# Patient Record
Sex: Female | Born: 1965 | Race: Black or African American | Hispanic: No | Marital: Married | Smoking: Never smoker
Health system: Southern US, Community
[De-identification: ages and names within clinical notes are randomized; demographics above are authoritative.]

## PROBLEM LIST (undated history)

## (undated) DIAGNOSIS — E0590 Thyrotoxicosis, unspecified without thyrotoxic crisis or storm: Secondary | ICD-10-CM

## (undated) DIAGNOSIS — K219 Gastro-esophageal reflux disease without esophagitis: Secondary | ICD-10-CM

## (undated) DIAGNOSIS — D649 Anemia, unspecified: Secondary | ICD-10-CM

---

## 2011-10-19 ENCOUNTER — Encounter (HOSPITAL_BASED_OUTPATIENT_CLINIC_OR_DEPARTMENT_OTHER): Payer: Self-pay | Admitting: *Deleted

## 2011-10-19 DIAGNOSIS — IMO0001 Reserved for inherently not codable concepts without codable children: Secondary | ICD-10-CM | POA: Insufficient documentation

## 2011-10-19 DIAGNOSIS — Z043 Encounter for examination and observation following other accident: Secondary | ICD-10-CM | POA: Insufficient documentation
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
## 2011-10-20 ENCOUNTER — Emergency Department (HOSPITAL_BASED_OUTPATIENT_CLINIC_OR_DEPARTMENT_OTHER)
Admission: EM | Admit: 2011-10-20 | Discharge: 2011-10-20 | Disposition: A | Discharge status: Home / Self Care | Payer: No Typology Code available for payment source | Attending: Emergency Medicine | Admitting: Emergency Medicine

## 2011-10-20 ENCOUNTER — Encounter (HOSPITAL_BASED_OUTPATIENT_CLINIC_OR_DEPARTMENT_OTHER): Payer: Self-pay | Admitting: Emergency Medicine

## 2011-10-20 DIAGNOSIS — M7918 Myalgia, other site: Secondary | ICD-10-CM

## 2011-10-20 NOTE — ED Notes (Signed)
D/c to home- no rx given
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
## 2011-10-20 NOTE — ED Notes (Signed)
Hunt MD at bedside. 

## 2013-01-15 ENCOUNTER — Other Ambulatory Visit: Payer: Self-pay

## 2013-01-15 DIAGNOSIS — Z1231 Encounter for screening mammogram for malignant neoplasm of breast: Secondary | ICD-10-CM

## 2013-02-17 ENCOUNTER — Ambulatory Visit

## 2013-02-26 ENCOUNTER — Ambulatory Visit

## 2013-03-16 ENCOUNTER — Ambulatory Visit

## 2013-03-26 ENCOUNTER — Ambulatory Visit
Admission: RE | Admit: 2013-03-26 | Discharge: 2013-03-26 | Disposition: A | Discharge status: Home / Self Care | Source: Ambulatory Visit

## 2013-03-26 DIAGNOSIS — Z1231 Encounter for screening mammogram for malignant neoplasm of breast: Secondary | ICD-10-CM

## 2013-05-25 ENCOUNTER — Other Ambulatory Visit (HOSPITAL_COMMUNITY): Payer: Self-pay | Admitting: Endocrinology

## 2013-05-25 DIAGNOSIS — E0590 Thyrotoxicosis, unspecified without thyrotoxic crisis or storm: Secondary | ICD-10-CM

## 2013-06-03 ENCOUNTER — Encounter (HOSPITAL_COMMUNITY): Admission: RE | Admit: 2013-06-03 | Source: Ambulatory Visit

## 2013-06-04 ENCOUNTER — Encounter (HOSPITAL_COMMUNITY)

## 2013-06-28 ENCOUNTER — Encounter (HOSPITAL_COMMUNITY)
Admission: RE | Admit: 2013-06-28 | Discharge: 2013-06-28 | Disposition: A | Discharge status: Home / Self Care | Source: Ambulatory Visit | Attending: Endocrinology | Admitting: Endocrinology

## 2013-06-28 DIAGNOSIS — E0590 Thyrotoxicosis, unspecified without thyrotoxic crisis or storm: Secondary | ICD-10-CM

## 2013-06-28 DIAGNOSIS — E0510 Thyrotoxicosis with toxic single thyroid nodule without thyrotoxic crisis or storm: Secondary | ICD-10-CM | POA: Insufficient documentation

## 2013-06-29 ENCOUNTER — Encounter (HOSPITAL_COMMUNITY)
Admission: RE | Admit: 2013-06-29 | Discharge: 2013-06-29 | Disposition: A | Discharge status: Home / Self Care | Source: Ambulatory Visit | Attending: Endocrinology | Admitting: Endocrinology

## 2013-06-29 ENCOUNTER — Encounter (HOSPITAL_COMMUNITY): Payer: Self-pay

## 2013-06-29 HISTORY — DX: Thyrotoxicosis, unspecified without thyrotoxic crisis or storm: E05.90

## 2013-06-29 MED ORDER — SODIUM IODIDE I 131 CAPSULE
8.6000 | Freq: Once | INTRAVENOUS | Status: AC | PRN
  Administered 2013-06-29: 8.6 via ORAL

## 2013-06-29 MED ORDER — SODIUM PERTECHNETATE TC 99M INJECTION
10.8000 | Freq: Once | INTRAVENOUS | Status: AC | PRN
  Administered 2013-06-29: 10.8 via INTRAVENOUS

## 2018-01-07 ENCOUNTER — Other Ambulatory Visit: Payer: Self-pay | Admitting: Obstetrics and Gynecology

## 2018-01-07 DIAGNOSIS — Z1231 Encounter for screening mammogram for malignant neoplasm of breast: Secondary | ICD-10-CM

## 2018-03-20 ENCOUNTER — Ambulatory Visit

## 2018-04-21 ENCOUNTER — Ambulatory Visit

## 2018-05-21 ENCOUNTER — Ambulatory Visit

## 2018-07-28 ENCOUNTER — Ambulatory Visit

## 2018-08-11 ENCOUNTER — Other Ambulatory Visit: Payer: Self-pay | Admitting: Obstetrics and Gynecology

## 2018-08-11 DIAGNOSIS — N644 Mastodynia: Secondary | ICD-10-CM

## 2018-09-05 ENCOUNTER — Encounter (HOSPITAL_BASED_OUTPATIENT_CLINIC_OR_DEPARTMENT_OTHER): Payer: Self-pay | Admitting: *Deleted

## 2018-09-05 ENCOUNTER — Emergency Department (HOSPITAL_BASED_OUTPATIENT_CLINIC_OR_DEPARTMENT_OTHER)
Admission: EM | Admit: 2018-09-05 | Discharge: 2018-09-06 | Disposition: A | Discharge status: Home / Self Care | Attending: Emergency Medicine | Admitting: Emergency Medicine

## 2018-09-05 ENCOUNTER — Other Ambulatory Visit: Payer: Self-pay

## 2018-09-05 DIAGNOSIS — R1013 Epigastric pain: Secondary | ICD-10-CM | POA: Diagnosis present

## 2018-09-05 DIAGNOSIS — N3000 Acute cystitis without hematuria: Secondary | ICD-10-CM | POA: Diagnosis not present

## 2018-09-05 DIAGNOSIS — Z79899 Other long term (current) drug therapy: Secondary | ICD-10-CM | POA: Insufficient documentation

## 2018-09-05 HISTORY — DX: Gastro-esophageal reflux disease without esophagitis: K21.9

## 2018-09-05 HISTORY — DX: Anemia, unspecified: D64.9

## 2018-09-05 LAB — CBC
HCT: 43.6 % (ref 36.0–46.0)
Hemoglobin: 13.9 g/dL (ref 12.0–15.0)
MCH: 26.3 pg (ref 26.0–34.0)
MCHC: 31.9 g/dL (ref 30.0–36.0)
MCV: 82.6 fL (ref 80.0–100.0)
NRBC: 0 % (ref 0.0–0.2)
Platelets: 191 10*3/uL (ref 150–400)
RBC: 5.28 MIL/uL — AB (ref 3.87–5.11)
RDW: 14.5 % (ref 11.5–15.5)
WBC: 7.4 10*3/uL (ref 4.0–10.5)

## 2018-09-05 LAB — LIPASE, BLOOD: Lipase: 26 U/L (ref 11–51)

## 2018-09-05 LAB — COMPREHENSIVE METABOLIC PANEL
ALK PHOS: 113 U/L (ref 38–126)
ALT: 22 U/L (ref 0–44)
ANION GAP: 7 (ref 5–15)
AST: 22 U/L (ref 15–41)
Albumin: 3.7 g/dL (ref 3.5–5.0)
BILIRUBIN TOTAL: 0.4 mg/dL (ref 0.3–1.2)
BUN: 13 mg/dL (ref 6–20)
CALCIUM: 8.8 mg/dL — AB (ref 8.9–10.3)
CO2: 24 mmol/L (ref 22–32)
CREATININE: 0.92 mg/dL (ref 0.44–1.00)
Chloride: 103 mmol/L (ref 98–111)
Glucose, Bld: 94 mg/dL (ref 70–99)
Potassium: 3.2 mmol/L — ABNORMAL LOW (ref 3.5–5.1)
Sodium: 134 mmol/L — ABNORMAL LOW (ref 135–145)
TOTAL PROTEIN: 7 g/dL (ref 6.5–8.1)

## 2018-09-05 MED ORDER — ALUM & MAG HYDROXIDE-SIMETH 200-200-20 MG/5ML PO SUSP
30.0000 mL | Freq: Once | ORAL | Status: AC
  Administered 2018-09-06: 30 mL via ORAL
  Filled 2018-09-05: qty 30

## 2018-09-05 NOTE — ED Notes (Signed)
Pt given water per MD. Pt also attempting to obtain urine sample.

## 2018-09-05 NOTE — ED Triage Notes (Signed)
Pt reports epigastric pain and nausea x 1 week. States today nausea got worse and she had tightness under her left breast. She went to Urgent Care last week for similar Sx

## 2018-09-05 NOTE — ED Provider Notes (Signed)
[facility] EMERGENCY DEPARTMENT Provider Note  CSN: [id number] Arrival date & time: 09/05/18 2146  Chief Complaint(s) Abdominal Pain  HPI Roberta Newman is a 53 y.o. female   The history is provided by the patient.  Abdominal Pain  Pain location:  Epigastric Pain quality comment:  Gurgling Pain radiates to:  Does not radiate Pain severity:  Mild Onset quality:  Gradual Duration:  1 week Timing:  Intermittent Progression:  Waxing and waning Chronicity:  New Context: diet changes   Relieved by: certain nonacidic foods. Exacerbated by: certain acidic foods. Ineffective treatments:  NSAIDs Associated symptoms: nausea   Associated symptoms: no anorexia, no chills, no constipation, no diarrhea, no fever and no melena     Past Medical History Past Medical History:  Diagnosis Date  . Acid reflux   . Anemia   . Hyperthyroidism    There are no active problems to display for this patient.  Home Medication(s) Prior to Admission medications   Medication Sig Start Date End Date Taking? Authorizing Provider  esomeprazole (NEXIUM) 20 MG capsule Take 20 mg by mouth daily at 12 noon.   Yes [provider]  FeFum-FePoly-FA-B Cmp-C-Biot (INTEGRA PLUS) CAPS TAKE 1 CAPSULE BY MOUTH ONCE DAILY 02/17/18  Yes [provider]  IRON COMBINATIONS PO Take by mouth.    [provider]  nitrofurantoin, macrocrystal-monohydrate, (MACROBID) 100 MG capsule Take 1 capsule (100 mg total) by mouth 2 (two) times daily for 5 days. 09/06/18 09/11/18  Nira Connardama, Hani Patnode Eduardo, MD                                                                                                                                    Past Surgical History Past Surgical History:  Procedure Laterality Date  . CESAREAN SECTION     Family History No family history on file.  Social History Social History   Tobacco Use  . Smoking status: Never Smoker  . Smokeless tobacco: Never Used  Substance Use  Topics  . Alcohol use: Never    Frequency: Never  . Drug use: Never   Allergies Penicillins  Review of Systems Review of Systems  Constitutional: Negative for chills and fever.  Gastrointestinal: Positive for abdominal pain and nausea. Negative for anorexia, constipation, diarrhea and melena.   All other systems are reviewed and are negative for acute change except as noted in the HPI  Physical Exam Vital Signs  I have reviewed the triage vital signs BP (!) 142/94   Pulse 70   Temp 97.9 F (36.6 C) (Oral)   Resp 16   Ht 5\' 3"  (1.6 m)   Wt 75.8 kg   SpO2 100%   BMI 29.58 kg/m   Physical Exam Vitals signs reviewed.  Constitutional:      General: She is not in acute distress.    Appearance: She is well-developed. She is not diaphoretic.  HENT:  Head: Normocephalic and atraumatic.     Right Ear: External ear normal.     Left Ear: External ear normal.     Nose: Nose normal.  Eyes:     General: No scleral icterus.    Conjunctiva/sclera: Conjunctivae normal.  Neck:     Musculoskeletal: Normal range of motion.     Trachea: Phonation normal.  Cardiovascular:     Rate and Rhythm: Normal rate and regular rhythm.  Pulmonary:     Effort: Pulmonary effort is normal. No respiratory distress.     Breath sounds: No stridor.  Abdominal:     General: There is no distension.     Tenderness: There is abdominal tenderness in the epigastric area and left upper quadrant. There is no guarding or rebound. Negative signs include Murphy's sign.  Musculoskeletal: Normal range of motion.  Neurological:     Mental Status: She is alert and oriented to person, place, and time.  Psychiatric:        Behavior: Behavior normal.     ED Results and Treatments Labs (all labs ordered are listed, but only abnormal results are displayed) Labs Reviewed  COMPREHENSIVE METABOLIC PANEL - Abnormal; Notable for the following components:      Result Value   Sodium 134 (*)    Potassium 3.2 (*)      Calcium 8.8 (*)    All other components within normal limits  CBC - Abnormal; Notable for the following components:   RBC 5.28 (*)    All other components within normal limits  URINALYSIS, ROUTINE W REFLEX MICROSCOPIC - Abnormal; Notable for the following components:   APPearance CLOUDY (*)    Specific Gravity, Urine <1.005 (*)    Ketones, ur 15 (*)    Nitrite POSITIVE (*)    Leukocytes, UA SMALL (*)    All other components within normal limits  URINALYSIS, MICROSCOPIC (REFLEX) - Abnormal; Notable for the following components:   Bacteria, UA MANY (*)    All other components within normal limits  URINE CULTURE  LIPASE, BLOOD  PREGNANCY, URINE                                                                                                                         EKG  EKG Interpretation  Date/Time:  Saturday September 05 2018 21:54:26 EST Ventricular Rate:  63 PR Interval:  166 QRS Duration: 94 QT Interval:  388 QTC Calculation: 397 R Axis:   76 Text Interpretation:  Normal sinus rhythm Minimal voltage criteria for LVH, may be normal variant Borderline ECG Confirmed by Blane OharaZavitz, Joshua [id number]) on 09/05/2018 10:38:22 PM      Radiology No results found. Pertinent labs & imaging results that were available during my care of the patient were reviewed by me and considered in my medical decision making (see chart for details).  Medications Ordered in ED Medications  alum & mag hydroxide-simeth (MAALOX/MYLANTA) 200-200-20 MG/5ML suspension 30 mL (30 mLs Oral Given 09/06/18 0028)  nitrofurantoin (  macrocrystal-monohydrate) (MACROBID) capsule 100 mg (100 mg Oral Given 09/06/18 0119)                                                                                                                                    Procedures Procedures  (including critical care time)  Medical Decision Making / ED Course I have reviewed the nursing notes for this encounter and the patient's prior records (if  available in EHR or on provided paperwork).    Patient presents with epigastric discomfort for approximately 1 week exacerbated with certain foods.  Abdomen with mild epigastric and left upper quadrant discomfort to palpation without evidence of peritonitis.  Suspicion for gastritis versus dyspepsia.  Will obtain screening labs to rule out other serious intra-abdominal inflammatory/infectious processes.  Labs reassuring without leukocytosis or anemia.  No significant electrolyte derangements or renal sufficiency.  No evidence of biliary obstruction or pancreatitis.  Presentation not suspicious for acute cholecystitis.  UA did reveal evidence of urinary tract infection.  Patient does not have symptoms concerning for pyelonephritis.  Will treat with Macrobid outpatient.  Doubt other serious intra-abdominal Fama to assess infectious process requiring imaging at this time.  Final Clinical Impression(s) / ED Diagnoses Final diagnoses:  Acute cystitis without hematuria  Dyspepsia   Disposition: Discharge  Condition: Good  I have discussed the results, Dx and Tx plan with the patient who expressed understanding and agree(s) with the plan. Discharge instructions discussed at great length. The patient was given strict return precautions who verbalized understanding of the instructions. No further questions at time of discharge.    ED Discharge Orders         Ordered    nitrofurantoin, macrocrystal-monohydrate, (MACROBID) 100 MG capsule  2 times daily     09/06/18 0136           Follow Up: Primary care provider  Schedule an appointment as soon as possible for a visit  in 5-7 days to recheck urine for appropriate treatment      This chart was dictated using voice recognition software.  Despite best efforts to proofread,  errors can occur which can change the documentation meaning.   Nira Conn, MD 09/06/18 [phone]

## 2018-09-06 LAB — URINALYSIS, ROUTINE W REFLEX MICROSCOPIC
BILIRUBIN URINE: NEGATIVE
Glucose, UA: NEGATIVE mg/dL
HGB URINE DIPSTICK: NEGATIVE
KETONES UR: 15 mg/dL — AB
Nitrite: POSITIVE — AB
PROTEIN: NEGATIVE mg/dL
Specific Gravity, Urine: <1.005 — ABNORMAL LOW (ref 1.005–1.030)
pH: 6 (ref 5.0–8.0)

## 2018-09-06 LAB — URINALYSIS, MICROSCOPIC (REFLEX)

## 2018-09-06 LAB — PREGNANCY, URINE: PREG TEST UR: NEGATIVE

## 2018-09-06 MED ORDER — NITROFURANTOIN MONOHYD MACRO 100 MG PO CAPS
100.0000 mg | ORAL_CAPSULE | Freq: Once | ORAL | Status: AC
  Administered 2018-09-06: 100 mg via ORAL
  Filled 2018-09-06: qty 1

## 2018-09-06 MED ORDER — NITROFURANTOIN MONOHYD MACRO 100 MG PO CAPS: 100.0000 mg | ORAL_CAPSULE | Freq: Two times a day (BID) | ORAL | 0 refills | Status: AC

## 2018-09-06 NOTE — ED Notes (Signed)
Pt states she feels "a lot better" after GI cocktail. States her pain is now 5/10.

## 2018-09-08 LAB — URINE CULTURE

## 2018-09-09 ENCOUNTER — Telehealth: Payer: Self-pay

## 2018-09-09 NOTE — Telephone Encounter (Signed)
Post ED Visit - Positive Culture Follow-up  Culture report reviewed by antimicrobial stewardship pharmacist:  []  Enzo Bi, Pharm.D. []  Celedonio Miyamoto, Pharm.D., BCPS AQ-ID []  Garvin Fila, Pharm.D., BCPS []  Georgina Pillion, Pharm.D., BCPS []  [place], [address].D., BCPS, AAHIVP []  Estella Husk, Pharm.D., BCPS, AAHIVP []  Lysle Pearl, PharmD, BCPS []  Phillips Climes, PharmD, BCPS []  Agapito Games, PharmD, BCPS []  Verlan Friends, PharmD [facility] D Positive urine culture Treated with Macrobid, organism sensitive to the same and no further patient follow-up is required at this time.  Jerry Caras 09/09/2018, 12:55 PM

## 2018-09-10 ENCOUNTER — Ambulatory Visit
Admission: RE | Admit: 2018-09-10 | Discharge: 2018-09-10 | Disposition: A | Discharge status: Home / Self Care | Source: Ambulatory Visit | Attending: Obstetrics and Gynecology | Admitting: Obstetrics and Gynecology

## 2018-09-10 DIAGNOSIS — Z1231 Encounter for screening mammogram for malignant neoplasm of breast: Secondary | ICD-10-CM

## 2020-11-01 ENCOUNTER — Emergency Department (HOSPITAL_BASED_OUTPATIENT_CLINIC_OR_DEPARTMENT_OTHER)

## 2020-11-01 ENCOUNTER — Other Ambulatory Visit: Payer: Self-pay

## 2020-11-01 ENCOUNTER — Encounter (HOSPITAL_BASED_OUTPATIENT_CLINIC_OR_DEPARTMENT_OTHER): Payer: Self-pay

## 2020-11-01 ENCOUNTER — Emergency Department (HOSPITAL_BASED_OUTPATIENT_CLINIC_OR_DEPARTMENT_OTHER)
Admission: EM | Admit: 2020-11-01 | Discharge: 2020-11-01 | Disposition: A | Discharge status: Home / Self Care | Attending: Emergency Medicine | Admitting: Emergency Medicine

## 2020-11-01 DIAGNOSIS — K2100 Gastro-esophageal reflux disease with esophagitis, without bleeding: Secondary | ICD-10-CM

## 2020-11-01 DIAGNOSIS — R079 Chest pain, unspecified: Secondary | ICD-10-CM | POA: Diagnosis present

## 2020-11-01 LAB — COMPREHENSIVE METABOLIC PANEL
ALT: 22 U/L (ref 0–44)
AST: 24 U/L (ref 15–41)
Albumin: 3.9 g/dL (ref 3.5–5.0)
Alkaline Phosphatase: 110 U/L (ref 38–126)
Anion gap: 9 (ref 5–15)
BUN: 17 mg/dL (ref 6–20)
CO2: 26 mmol/L (ref 22–32)
Calcium: 9.2 mg/dL (ref 8.9–10.3)
Chloride: 102 mmol/L (ref 98–111)
Creatinine, Ser: 0.96 mg/dL (ref 0.44–1.00)
GFR, Estimated: >60 mL/min (ref >60)
Glucose, Bld: 105 mg/dL — ABNORMAL HIGH (ref 70–99)
Potassium: 3.3 mmol/L — ABNORMAL LOW (ref 3.5–5.1)
Sodium: 137 mmol/L (ref 135–145)
Total Bilirubin: 0.3 mg/dL (ref 0.3–1.2)
Total Protein: 7.5 g/dL (ref 6.5–8.1)

## 2020-11-01 LAB — CBC WITH DIFFERENTIAL/PLATELET
Abs Immature Granulocytes: 0.01 K/uL (ref 0.00–0.07)
Basophils Absolute: 0 K/uL (ref 0.0–0.1)
Basophils Relative: 1 %
Eosinophils Absolute: 0.3 K/uL (ref 0.0–0.5)
Eosinophils Relative: 6 %
HCT: 44.7 % (ref 36.0–46.0)
Hemoglobin: 14.6 g/dL (ref 12.0–15.0)
Immature Granulocytes: 0 %
Lymphocytes Relative: 40 %
Lymphs Abs: 1.8 K/uL (ref 0.7–4.0)
MCH: 26.4 pg (ref 26.0–34.0)
MCHC: 32.7 g/dL (ref 30.0–36.0)
MCV: 81 fL (ref 80.0–100.0)
Monocytes Absolute: 0.3 K/uL (ref 0.1–1.0)
Monocytes Relative: 7 %
Neutro Abs: 2.1 K/uL (ref 1.7–7.7)
Neutrophils Relative %: 46 %
Platelets: 210 K/uL (ref 150–400)
RBC: 5.52 MIL/uL — ABNORMAL HIGH (ref 3.87–5.11)
RDW: 15 % (ref 11.5–15.5)
WBC: 4.4 K/uL (ref 4.0–10.5)
nRBC: 0 % (ref 0.0–0.2)

## 2020-11-01 LAB — TROPONIN I (HIGH SENSITIVITY): Troponin I (High Sensitivity): 3 ng/L (ref <18)

## 2020-11-01 MED ORDER — ALUM & MAG HYDROXIDE-SIMETH 200-200-20 MG/5ML PO SUSP
30.0000 mL | Freq: Once | ORAL | Status: AC
  Administered 2020-11-01: 30 mL via ORAL
  Filled 2020-11-01: qty 30

## 2020-11-01 MED ORDER — LIDOCAINE VISCOUS HCL 2 % MT SOLN
15.0000 mL | Freq: Once | OROMUCOSAL | Status: AC
  Administered 2020-11-01: 15 mL via ORAL
  Filled 2020-11-01: qty 15

## 2020-11-01 NOTE — Discharge Instructions (Addendum)
Follow up with your Physician for recheck.  Take prilosec as directed

## 2020-11-01 NOTE — ED Notes (Signed)
When questioned on response to pain medication, pt states she felt "fantastic" ED PA in to see pt

## 2020-11-01 NOTE — ED Provider Notes (Signed)
[facility] EMERGENCY DEPARTMENT Provider Note   CSN: [id number] Arrival date & time: 11/01/20  1619     History Chief Complaint  Patient presents with  . Chest Pain    Roberta Newman is a 55 y.o. female.  Pt reports she has had 3 days of sharp pain in her mid upper chest.  Pt reports she has a pain shoot thru her chest.  Pt reports pain last for a second and then is gone.  Pt has  A history of reflux.    The history is provided by the patient. No language interpreter was used.  Chest Pain Pain quality: aching   Pain radiates to:  Does not radiate Pain severity:  Moderate Onset quality:  Gradual Timing:  Constant Progression:  Worsening Chronicity:  New Relieved by:  Nothing      Past Medical History:  Diagnosis Date  . Acid reflux   . Anemia   . Hyperthyroidism     There are no problems to display for this patient.   Past Surgical History:  Procedure Laterality Date  . CESAREAN SECTION       OB History   No obstetric history on file.     Family History  Problem Relation Age of Onset  . Breast cancer Neg Hx     Social History   Tobacco Use  . Smoking status: Never Smoker  . Smokeless tobacco: Never Used  Vaping Use  . Vaping Use: Never used  Substance Use Topics  . Alcohol use: Never  . Drug use: Never    Home Medications Prior to Admission medications   Medication Sig Start Date End Date Taking? Authorizing Provider  esomeprazole (NEXIUM) 20 MG capsule Take 20 mg by mouth daily at 12 noon.    [provider]  FeFum-FePoly-FA-B Cmp-C-Biot (INTEGRA PLUS) CAPS TAKE 1 CAPSULE BY MOUTH ONCE DAILY 02/17/18   [provider]  IRON COMBINATIONS PO Take by mouth.    [provider]    Allergies    Penicillins  Review of Systems   Review of Systems  Cardiovascular: Positive for chest pain.  All other systems reviewed and are negative.   Physical Exam Updated Vital Signs BP (!) 143/89   Pulse 74    Temp 97.7 F (36.5 C) (Oral)   Resp 20   Ht 5\' 3"  (1.6 m)   Wt 77.1 kg   SpO2 99%   BMI 30.11 kg/m   Physical Exam Vitals and nursing note reviewed.  Constitutional:      Appearance: She is well-developed.  HENT:     Head: Normocephalic.  Cardiovascular:     Heart sounds: Normal heart sounds.  Pulmonary:     Effort: Pulmonary effort is normal.  Abdominal:     General: Bowel sounds are normal. There is no distension.     Palpations: Abdomen is soft.  Musculoskeletal:        General: Normal range of motion.     Cervical back: Normal range of motion.  Skin:    General: Skin is warm.  Neurological:     General: No focal deficit present.     Mental Status: She is alert and oriented to person, place, and time.     ED Results / Procedures / Treatments   Labs (all labs ordered are listed, but only abnormal results are displayed) Labs Reviewed  CBC WITH DIFFERENTIAL/PLATELET - Abnormal; Notable for the following components:      Result Value  RBC 5.52 (*)    All other components within normal limits  COMPREHENSIVE METABOLIC PANEL - Abnormal; Notable for the following components:   Potassium 3.3 (*)    Glucose, Bld 105 (*)    All other components within normal limits  TROPONIN I (HIGH SENSITIVITY)  TROPONIN I (HIGH SENSITIVITY)    EKG EKG Interpretation  Date/Time:  Wednesday November 01 2020 16:28:39 EDT Ventricular Rate:  68 PR Interval:  159 QRS Duration: 99 QT Interval:  382 QTC Calculation: 407 R Axis:   58 Text Interpretation: Sinus rhythm Probable left ventricular hypertrophy normal axis no acute ischemia Confirmed by Pieter Partridge ([id number]) on 11/01/2020 4:52:04 PM   Radiology DG Chest Port 1 View  Result Date: 11/01/2020 CLINICAL DATA:  Chest pain for 3 days EXAM: PORTABLE CHEST 1 VIEW COMPARISON:  05/01/2018 FINDINGS: The heart size and mediastinal contours are within normal limits. Both lungs are clear. The visualized skeletal structures are unremarkable.  IMPRESSION: No active disease. Electronically Signed   By: Sharlet Salina M.D.   On: 11/01/2020 17:42    Procedures Procedures   Medications Ordered in ED Medications  alum & mag hydroxide-simeth (MAALOX/MYLANTA) 200-200-20 MG/5ML suspension 30 mL (30 mLs Oral Given 11/01/20 1816)    And  lidocaine (XYLOCAINE) 2 % viscous mouth solution 15 mL (15 mLs Oral Given 11/01/20 1816)    ED Course  I have reviewed the triage vital signs and the nursing notes.  Pertinent labs & imaging results that were available during my care of the patient were reviewed by me and considered in my medical decision making (see chart for details).    MDM Rules/Calculators/A&P                          MDM:  ekg normal chest xray is normal.  Abs reviewed and discussed with pt.  Pt given gi cocktail.  Pt reports episodes resolved.  I suspect symptoms are gi related.  Pt advised to follow up with her MD.  Symptoms atypical for angina.  Perc negative.   Final Clinical Impression(s) / ED Diagnoses Final diagnoses:  Gastroesophageal reflux disease with esophagitis, unspecified whether hemorrhage  Nonspecific chest pain    Rx / DC Orders ED Discharge Orders    None    An After Visit Summary was printed and given to the patient.    Osie Cheeks 11/01/20 1943    Koleen Distance, MD 11/01/20 [phone]

## 2020-11-01 NOTE — ED Triage Notes (Signed)
Pt c/o intermittent CP x 3 days-NAD-steady gait 

## 2020-11-01 NOTE — ED Notes (Signed)
Patient given discharge papers and reviewed.  States feeling better.

## 2020-11-01 NOTE — ED Notes (Signed)
Patient present with complaints of stabbing chest with eating or swallowing food.  Denies SOB, Denies chest pain at present.  States this has been more present in last three days but have experienced symptoms or last several months.

## 2022-06-10 IMAGING — DX DG CHEST 1V PORT
1 series · 1 of 1 positions shown · non-contrast
Comparison: 05/01/2018

CLINICAL DATA: Chest pain for 3 days

EXAM:
PORTABLE CHEST 1 VIEW

[chest ap]
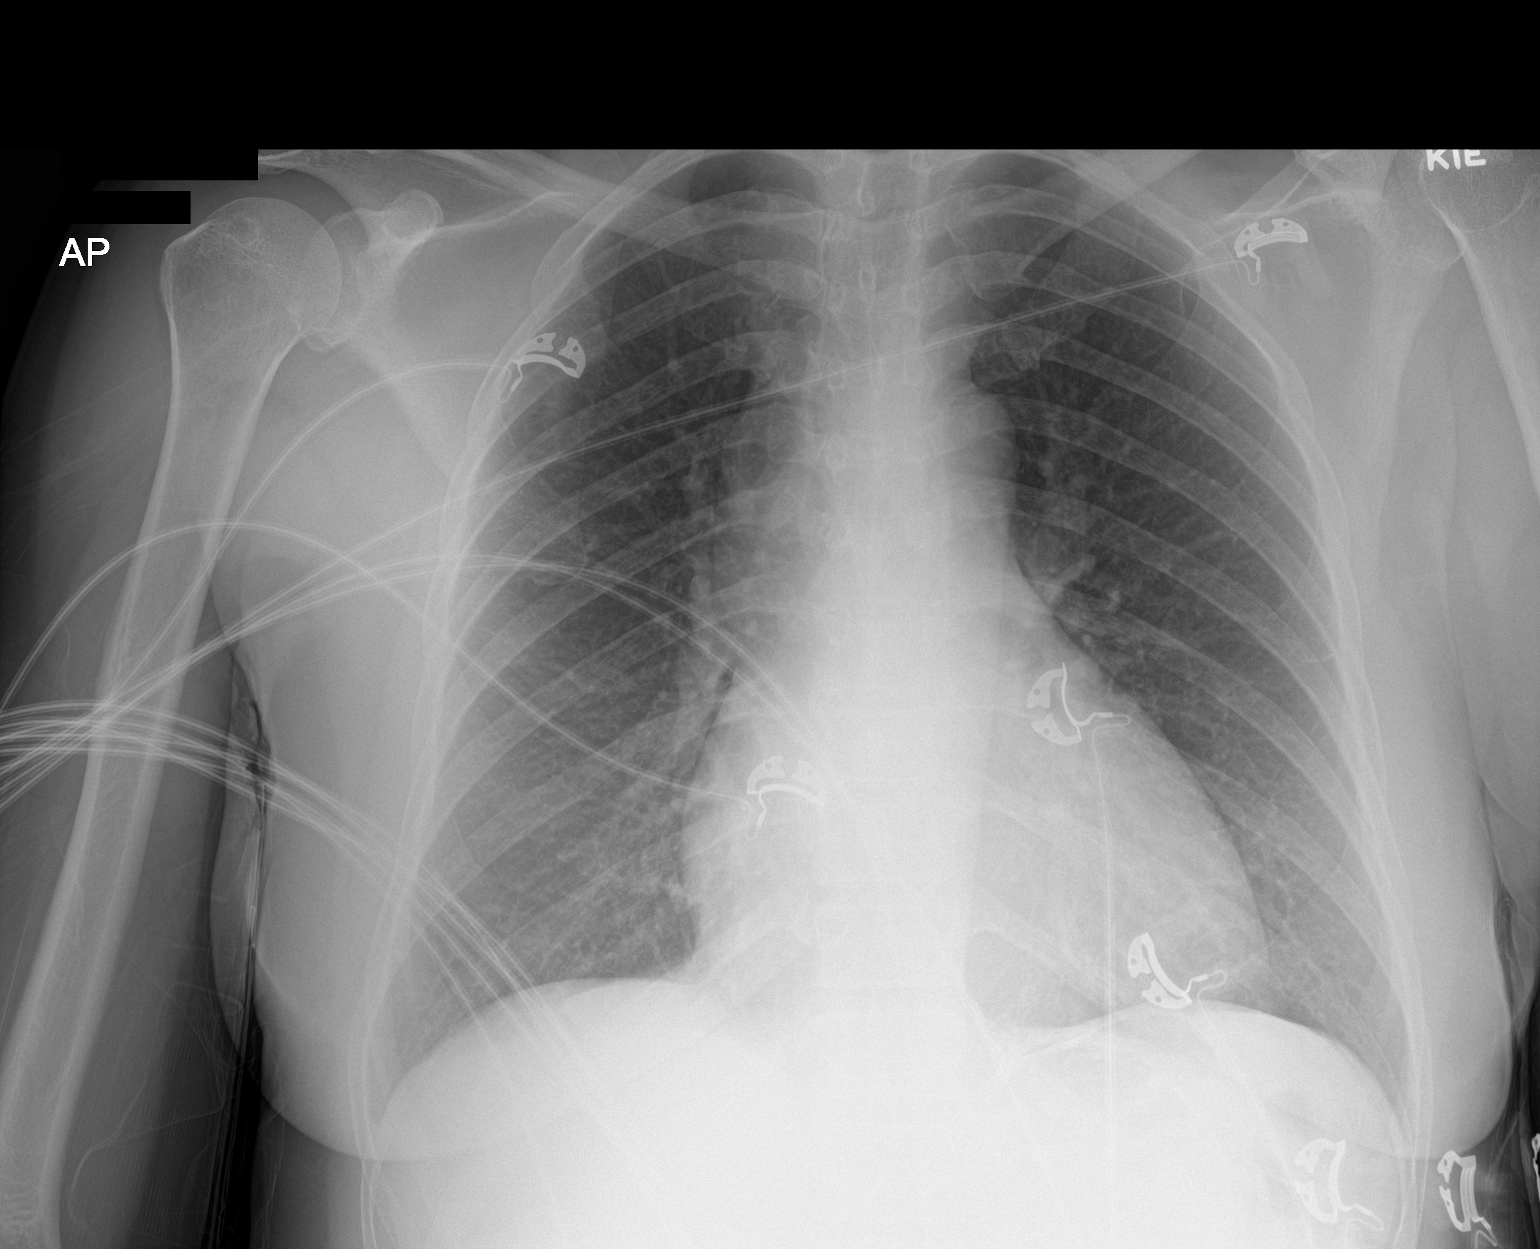

[1 of 1 positions shown; findings below may reference images not displayed]

FINDINGS: The heart size and mediastinal contours are within normal limits.
Both lungs are clear. The visualized skeletal structures are
unremarkable.
IMPRESSION: No active disease.
